# Patient Record
Sex: Male | Born: 1935 | Race: White | Hispanic: No | Marital: Married
Health system: Southern US, Community
[De-identification: ages and names within clinical notes are randomized; demographics above are authoritative.]

---

## 2004-02-13 ENCOUNTER — Other Ambulatory Visit: Payer: Self-pay

## 2004-12-20 ENCOUNTER — Ambulatory Visit: Payer: Self-pay | Admitting: Urology

## 2005-01-19 ENCOUNTER — Ambulatory Visit: Payer: Self-pay | Admitting: Urology

## 2005-01-24 ENCOUNTER — Ambulatory Visit: Payer: Self-pay | Admitting: Urology

## 2005-07-25 ENCOUNTER — Other Ambulatory Visit: Payer: Self-pay

## 2005-07-25 ENCOUNTER — Inpatient Hospital Stay: Payer: Self-pay | Admitting: Cardiology

## 2006-02-01 ENCOUNTER — Ambulatory Visit (HOSPITAL_COMMUNITY): Admission: RE | Admit: 2006-02-01 | Discharge: 2006-02-01 | Payer: Self-pay | Admitting: *Deleted

## 2006-02-08 ENCOUNTER — Ambulatory Visit (HOSPITAL_COMMUNITY): Admission: RE | Admit: 2006-02-08 | Discharge: 2006-02-09 | Payer: Self-pay | Admitting: *Deleted

## 2006-06-27 ENCOUNTER — Ambulatory Visit: Payer: Self-pay | Admitting: Urology

## 2006-06-27 ENCOUNTER — Other Ambulatory Visit: Payer: Self-pay

## 2006-07-03 ENCOUNTER — Ambulatory Visit: Payer: Self-pay | Admitting: Urology

## 2007-01-24 ENCOUNTER — Ambulatory Visit: Payer: Self-pay | Admitting: Internal Medicine

## 2007-05-16 ENCOUNTER — Ambulatory Visit: Payer: Self-pay | Admitting: Unknown Physician Specialty

## 2008-01-26 ENCOUNTER — Other Ambulatory Visit: Payer: Self-pay

## 2008-01-26 ENCOUNTER — Inpatient Hospital Stay: Payer: Self-pay | Admitting: Internal Medicine

## 2008-03-12 ENCOUNTER — Ambulatory Visit: Payer: Self-pay | Admitting: Internal Medicine

## 2008-03-12 ENCOUNTER — Inpatient Hospital Stay: Payer: Self-pay | Admitting: Internal Medicine

## 2008-03-13 ENCOUNTER — Other Ambulatory Visit: Payer: Self-pay

## 2008-05-14 ENCOUNTER — Ambulatory Visit: Payer: Self-pay | Admitting: Unknown Physician Specialty

## 2008-06-14 ENCOUNTER — Ambulatory Visit: Payer: Self-pay | Admitting: Urology

## 2008-12-31 ENCOUNTER — Emergency Department: Payer: Self-pay | Admitting: Emergency Medicine

## 2009-10-20 ENCOUNTER — Inpatient Hospital Stay: Payer: Self-pay | Admitting: Internal Medicine

## 2009-12-08 ENCOUNTER — Ambulatory Visit: Payer: Self-pay | Admitting: Unknown Physician Specialty

## 2010-09-06 ENCOUNTER — Ambulatory Visit: Payer: Self-pay | Admitting: Internal Medicine

## 2010-09-24 ENCOUNTER — Ambulatory Visit: Payer: Self-pay | Admitting: Internal Medicine

## 2010-10-16 ENCOUNTER — Ambulatory Visit: Payer: Self-pay | Admitting: Neurology

## 2010-10-24 ENCOUNTER — Ambulatory Visit: Payer: Self-pay | Admitting: Internal Medicine

## 2010-10-30 ENCOUNTER — Ambulatory Visit: Payer: Self-pay | Admitting: Neurology

## 2010-11-20 ENCOUNTER — Inpatient Hospital Stay: Payer: Self-pay | Admitting: Internal Medicine

## 2010-12-18 ENCOUNTER — Ambulatory Visit (HOSPITAL_COMMUNITY)
Admission: RE | Admit: 2010-12-18 | Discharge: 2010-12-18 | Disposition: A | Discharge status: Home / Self Care | Payer: Medicare Other | Source: Ambulatory Visit | Attending: Neurosurgery | Admitting: Neurosurgery

## 2010-12-18 ENCOUNTER — Other Ambulatory Visit (HOSPITAL_COMMUNITY): Payer: Self-pay | Admitting: Neurosurgery

## 2010-12-18 ENCOUNTER — Encounter (HOSPITAL_COMMUNITY)
Admission: RE | Admit: 2010-12-18 | Discharge: 2010-12-18 | Disposition: A | Discharge status: Home / Self Care | Payer: Medicare Other | Source: Ambulatory Visit | Attending: Neurosurgery | Admitting: Neurosurgery

## 2010-12-18 DIAGNOSIS — Z01812 Encounter for preprocedural laboratory examination: Secondary | ICD-10-CM | POA: Insufficient documentation

## 2010-12-18 DIAGNOSIS — Z01811 Encounter for preprocedural respiratory examination: Secondary | ICD-10-CM

## 2010-12-18 DIAGNOSIS — Z01818 Encounter for other preprocedural examination: Secondary | ICD-10-CM | POA: Insufficient documentation

## 2010-12-18 LAB — BASIC METABOLIC PANEL
BUN: 15 mg/dL (ref 6–23)
Calcium: 9.7 mg/dL (ref 8.4–10.5)
Creatinine, Ser: 1.44 mg/dL — ABNORMAL HIGH (ref 0.50–1.35)
GFR calc non Af Amer: 48 mL/min — ABNORMAL LOW (ref >60)
Glucose, Bld: 107 mg/dL — ABNORMAL HIGH (ref 70–99)
Sodium: 132 mEq/L — ABNORMAL LOW (ref 135–145)

## 2010-12-18 LAB — PROTIME-INR: Prothrombin Time: 13 seconds (ref 11.6–15.2)

## 2010-12-18 LAB — SURGICAL PCR SCREEN
MRSA, PCR: NEGATIVE
Staphylococcus aureus: POSITIVE — AB

## 2010-12-18 LAB — CBC
MCH: 32 pg (ref 26.0–34.0)
MCHC: 34.5 g/dL (ref 30.0–36.0)
Platelets: 234 10*3/uL (ref 150–400)
RBC: 5.28 MIL/uL (ref 4.22–5.81)
RDW: 13.3 % (ref 11.5–15.5)

## 2010-12-21 ENCOUNTER — Inpatient Hospital Stay (HOSPITAL_COMMUNITY)
Admission: RE | Admit: 2010-12-21 | Discharge: 2010-12-22 | DRG: 472 | Disposition: A | Discharge status: Home / Self Care | Payer: Medicare Other | Source: Ambulatory Visit | Attending: Neurosurgery | Admitting: Neurosurgery

## 2010-12-21 ENCOUNTER — Inpatient Hospital Stay (HOSPITAL_COMMUNITY): Payer: Medicare Other

## 2010-12-21 DIAGNOSIS — M5020 Other cervical disc displacement, unspecified cervical region: Secondary | ICD-10-CM | POA: Diagnosis present

## 2010-12-21 DIAGNOSIS — M4712 Other spondylosis with myelopathy, cervical region: Principal | ICD-10-CM | POA: Diagnosis present

## 2010-12-21 DIAGNOSIS — G9589 Other specified diseases of spinal cord: Secondary | ICD-10-CM | POA: Diagnosis present

## 2010-12-21 DIAGNOSIS — I739 Peripheral vascular disease, unspecified: Secondary | ICD-10-CM | POA: Diagnosis present

## 2010-12-21 DIAGNOSIS — E119 Type 2 diabetes mellitus without complications: Secondary | ICD-10-CM | POA: Diagnosis present

## 2010-12-21 DIAGNOSIS — Z794 Long term (current) use of insulin: Secondary | ICD-10-CM

## 2010-12-21 DIAGNOSIS — R339 Retention of urine, unspecified: Secondary | ICD-10-CM | POA: Diagnosis present

## 2010-12-21 DIAGNOSIS — I10 Essential (primary) hypertension: Secondary | ICD-10-CM | POA: Diagnosis present

## 2010-12-21 DIAGNOSIS — Z01812 Encounter for preprocedural laboratory examination: Secondary | ICD-10-CM

## 2010-12-21 DIAGNOSIS — Z01818 Encounter for other preprocedural examination: Secondary | ICD-10-CM

## 2010-12-21 DIAGNOSIS — G4733 Obstructive sleep apnea (adult) (pediatric): Secondary | ICD-10-CM | POA: Diagnosis present

## 2010-12-21 LAB — COMPREHENSIVE METABOLIC PANEL
Albumin: 3.2 g/dL — ABNORMAL LOW (ref 3.5–5.2)
Alkaline Phosphatase: 142 U/L — ABNORMAL HIGH (ref 39–117)
BUN: 17 mg/dL (ref 6–23)
BUN: 15 mg/dL (ref 6–23)
Calcium: 9 mg/dL (ref 8.4–10.5)
Chloride: 98 mEq/L (ref 96–112)
Creatinine, Ser: 1.26 mg/dL (ref 0.50–1.35)
GFR calc Af Amer: >60 mL/min (ref >60)
GFR calc Af Amer: >60 mL/min (ref >60)
GFR calc non Af Amer: 56 mL/min — ABNORMAL LOW (ref >60)
Glucose, Bld: 145 mg/dL — ABNORMAL HIGH (ref 70–99)
Total Bilirubin: 0.3 mg/dL (ref 0.3–1.2)
Total Protein: 6.8 g/dL (ref 6.0–8.3)

## 2010-12-21 LAB — PROTIME-INR
INR: 0.92 (ref 0.00–1.49)
Prothrombin Time: 12.3 seconds (ref 11.6–15.2)

## 2010-12-21 LAB — GLUCOSE, CAPILLARY
Glucose-Capillary: 137 mg/dL — ABNORMAL HIGH (ref 70–99)
Glucose-Capillary: 322 mg/dL — ABNORMAL HIGH (ref 70–99)

## 2010-12-22 LAB — GLUCOSE, CAPILLARY
Glucose-Capillary: 241 mg/dL — ABNORMAL HIGH (ref 70–99)
Glucose-Capillary: 211 mg/dL — ABNORMAL HIGH (ref 70–99)

## 2010-12-26 NOTE — Op Note (Signed)
NAMEJAMARKIS, BRANAM NO.:  [id number]  MEDICAL RECORD NO.:  [id number]  LOCATION:  3109                         FACILITY:  [facility]  PHYSICIAN:  Danae Orleans. Venetia Maxon, M.D.  DATE OF BIRTH:  11/18/1933  DATE OF PROCEDURE:  12/21/2010 DATE OF DISCHARGE:                              OPERATIVE REPORT   PREOPERATIVE DIAGNOSES:  Herniated cervical disk, C3-4 with cervical myelopathy; cervical spondylosis with myelopathy; cervical radiculopathy; and chronic obstructive pulmonary disease.  POSTOPERATIVE DIAGNOSES:  Herniated cervical disk, C3-4 with cervical myelopathy; cervical spondylosis with myelopathy; cervical radiculopathy; and chronic obstructive pulmonary disease.  PROCEDURE:  Anterior cervical decompression and fusion at C3-4 with PEEK interbody cage, morselized bone autograft, allograft, and anterior cervical plate.  SURGEON:  Danae Orleans. Venetia Maxon, MD  ASSISTANT:  Clydene Fake, MD  ANESTHESIA:  General endotracheal anesthesia.  ESTIMATED BLOOD LOSS:  Minimal.  COMPLICATIONS:  None.  DISPOSITION:  Recovery.  INDICATIONS:  Christopher Diaz is a 77 year old man with a profound cervical myelopathy with severe cervical stenosis at C3-4 with focal myelomalacia at this level.  It was elected to take him to surgery for anterior cervical decompression and fusion at this affected level.  PROCEDURE:  Following the uncomplicated induction of general endotracheal anesthesia and placement of intravenous lines with the patient maintained in neutral alignment and using the glide scope, the patient was continued in neutral alignment, placed on horseshoe head holder, placed in 5 pounds halter traction.  His anterior neck was then prepped and draped in usual sterile fashion.  The area of planned incision was infiltrated with local lidocaine.  Incision was made from the midline to the anterior border of sternocleidomastoid muscle on the upper neck creases on the left side of  midline, carried through platysma layer to expose the anterior border sternocleidomastoid muscle using blunt dissection.  The carotid sheath was kept lateral and trachea and esophagus kept medial.  The common facial vein was identified and coagulated and cut.  The initial x-ray showed markers at the C4-5 and C5- 6 level and consequently exposure was carried cephalad one level to the C3-4 level where second x-ray was obtained, which demonstrated spinal needle at the C3-4 level.  Subsequently, the longus colli muscles were taken down from the anterior cervical spine with electrocautery and Key elevator, and shadow line retractor blades were placed to facilitate exposure.  The interspace at C3-4 was incised and disk material was removed in piecemeal fashion.  Distraction pins were placed at C3 and C4 and using gentle distraction, the interspace was opened.  High-speed drill was used to remove uncinate spurs and eburnate the endplates of C3 and C4.  There was a deep osteophyte, which was removed with high-speed drill and more completely removed with Kerrison rongeurs.  The spinal cord dura was decompressed as were both C4 nerve roots as it they extended out the neural foramina.  Hemostasis was assured with Gelfoam soaked in thrombin.  After trial sizing, it was elected to use a 6-mm PEEK interbody cage, which was packed with allograft bone graft, reconstituted with PureGen stem cells and additionally, bone autograft, which was retained from drilling of the endplates at the C3  and C4 level.  The implant then inserted in the interspace and countersunk appropriately.  Traction weight was removed.  A 14-mm Trestle anterior cervical plate was affixed to the anterior cervical spine using variable angle 14-mm screws, two at C3 and two at T4.  All screws had excellent purchase.  Locking mechanisms were engaged.  Final x-ray demonstrated well-positioned interbody graft and anterior cervical plate.   Wound was then irrigated.  Soft tissues were inspected and found to be in good repair.  Hemostasis was assured.  The platysma layer was closed with 3-0 Vicryl sutures, and skin edges were approximated with 3-0 Vicryl subcuticular stitch.  The wound was dressed with Dermabond.  The patient was extubated in the operating room and taken to recovery in stable satisfactory condition having tolerated the operation well.  Counts were correct at the end of the case.     Danae Orleans. Venetia Maxon, M.D.     JDS/MEDQ  D:  12/21/2010  T:  12/22/2010  Job:  [id number]  Electronically Signed by Maeola Harman M.D. on 12/26/2010 08:50:12 AM

## 2010-12-28 NOTE — Discharge Summary (Signed)
  NAMEDASHAUN, ONSTOTT NO.:  [id number]  MEDICAL RECORD NO.:  [id number]  LOCATION:  3017                         FACILITY:  [facility]  PHYSICIAN:  Stefani Dama, M.D.  DATE OF BIRTH:  [birth year]  DATE OF ADMISSION:  12/21/2010 DATE OF DISCHARGE:  12/22/2010                              DISCHARGE SUMMARY   ADMITTING DIAGNOSIS:  Cervical spondylosis with myelopathy C3-C4.  DISCHARGE AND FINAL DIAGNOSES: 1. Cervical spondylosis with myelopathy C3-C4 status post anterior     cervical decompression arthrodesis C3-C4. 2. Urinary retention.  CONDITION ON DISCHARGE:  Improving.  HOSPITAL COURSE:  Mr. Bert Ptacek is a 77 year old individual who has had significant problems with spondylitic myelopathy including quadriparesis and dysesthesias in all four extremities.  He had evidence of spinal cord compression at the level of C3-C4 secondary to spondylitic overgrowth and severely degenerated disk.  He was advised regarding need for surgical decompression.  This was performed on December 21, 2010 postoperatively.  The patient awoke well and minimal difficulties with swallowing however upon ambulation, he felt better, but he could not spontaneously urinate, had urinary retention with documented urine quantities of 700 mL in his bladder before a straight catheter was passed.  He subsequently was started on Flomax and after an additional straight catheterization, the patient was able to void spontaneously with residuals as low as 20 mL.  At the current time, he is discharged home with prescription for Vicodin, maintains his other home medications which include: 1. Linagliptin 5 mg a day. 2. Zetia. 3. Insulin. 4. Celexa. 5. Amlodipine. 6. Advair inhalers.  She will be seen in followup in 3 weeks' time.     Stefani Dama, M.D.     Merla Riches  D:  12/22/2010  T:  12/23/2010  Job:  [id number]  Electronically Signed by Barnett Abu M.D. on 12/28/2010 05:23:49 PM

## 2011-06-03 ENCOUNTER — Inpatient Hospital Stay: Payer: Self-pay | Admitting: Internal Medicine

## 2011-07-10 ENCOUNTER — Ambulatory Visit: Payer: Self-pay | Admitting: Internal Medicine

## 2011-10-29 ENCOUNTER — Ambulatory Visit: Payer: Self-pay

## 2012-03-26 ENCOUNTER — Other Ambulatory Visit: Payer: Self-pay | Admitting: Neurology

## 2012-05-16 ENCOUNTER — Ambulatory Visit: Payer: Self-pay | Admitting: Neurology

## 2012-06-25 ENCOUNTER — Ambulatory Visit: Payer: Self-pay | Admitting: Internal Medicine

## 2012-07-21 ENCOUNTER — Inpatient Hospital Stay: Payer: Self-pay | Admitting: Student

## 2012-07-21 LAB — CBC
HGB: 16.5 g/dL (ref 13.0–18.0)
MCH: 30.4 pg (ref 26.0–34.0)
MCHC: 32.5 g/dL (ref 32.0–36.0)
RDW: 13.8 % (ref 11.5–14.5)

## 2012-07-21 LAB — URINALYSIS, COMPLETE
Bilirubin,UR: NEGATIVE
Blood: NEGATIVE
Ketone: NEGATIVE
Leukocyte Esterase: NEGATIVE
Nitrite: NEGATIVE
RBC,UR: 2 /HPF (ref 0–5)

## 2012-07-21 LAB — COMPREHENSIVE METABOLIC PANEL
Alkaline Phosphatase: 206 U/L — ABNORMAL HIGH (ref 50–136)
Anion Gap: 8 (ref 7–16)
Chloride: 96 mmol/L — ABNORMAL LOW (ref 98–107)
Creatinine: 2.09 mg/dL — ABNORMAL HIGH (ref 0.60–1.30)
EGFR (African American): 35 — ABNORMAL LOW
EGFR (Non-African Amer.): 30 — ABNORMAL LOW
Glucose: 175 mg/dL — ABNORMAL HIGH (ref 65–99)
Osmolality: 273 (ref 275–301)
SGOT(AST): 59 U/L — ABNORMAL HIGH (ref 15–37)
SGPT (ALT): 73 U/L (ref 12–78)
Sodium: 131 mmol/L — ABNORMAL LOW (ref 136–145)
Total Protein: 7.3 g/dL (ref 6.4–8.2)

## 2012-07-21 LAB — TROPONIN I: Troponin-I: <0.02 ng/mL

## 2012-07-22 LAB — TROPONIN I
Troponin-I: <0.02 ng/mL
Troponin-I: <0.02 ng/mL

## 2012-07-23 LAB — BASIC METABOLIC PANEL
Anion Gap: 12 (ref 7–16)
BUN: 56 mg/dL — ABNORMAL HIGH (ref 7–18)
EGFR (African American): 31 — ABNORMAL LOW
EGFR (Non-African Amer.): 26 — ABNORMAL LOW
Potassium: 3.3 mmol/L — ABNORMAL LOW (ref 3.5–5.1)

## 2012-07-23 LAB — URINE CULTURE

## 2012-07-24 LAB — BASIC METABOLIC PANEL WITH GFR
Anion Gap: 8
BUN: 51 mg/dL — ABNORMAL HIGH
Calcium, Total: 9.7 mg/dL
Chloride: 108 mmol/L — ABNORMAL HIGH
Co2: 23 mmol/L
Creatinine: 2.09 mg/dL — ABNORMAL HIGH
EGFR (African American): 35 — ABNORMAL LOW
EGFR (Non-African Amer.): 30 — ABNORMAL LOW
Glucose: 208 mg/dL — ABNORMAL HIGH
Osmolality: 297
Potassium: 4.1 mmol/L
Sodium: 139 mmol/L

## 2012-07-25 LAB — BASIC METABOLIC PANEL
Anion Gap: 8 (ref 7–16)
Chloride: 109 mmol/L — ABNORMAL HIGH (ref 98–107)
Creatinine: 1.65 mg/dL — ABNORMAL HIGH (ref 0.60–1.30)
EGFR (African American): 46 — ABNORMAL LOW
EGFR (Non-African Amer.): 40 — ABNORMAL LOW
Potassium: 3.6 mmol/L (ref 3.5–5.1)
Sodium: 140 mmol/L (ref 136–145)

## 2012-07-26 ENCOUNTER — Ambulatory Visit: Payer: Self-pay | Admitting: Internal Medicine

## 2012-07-27 LAB — BASIC METABOLIC PANEL
Anion Gap: 12 (ref 7–16)
Calcium, Total: 9.4 mg/dL (ref 8.5–10.1)
Creatinine: 1.45 mg/dL — ABNORMAL HIGH (ref 0.60–1.30)
EGFR (African American): 54 — ABNORMAL LOW
Glucose: 261 mg/dL — ABNORMAL HIGH (ref 65–99)
Sodium: 141 mmol/L (ref 136–145)

## 2012-08-16 IMAGING — CR DG CHEST 2V
3 series · 3 of 3 positions shown · non-contrast
Comparison: None

CLINICAL DATA: Preop for cervical fusion.

CHEST - 2 VIEW

[view not recorded (1 of 3)]
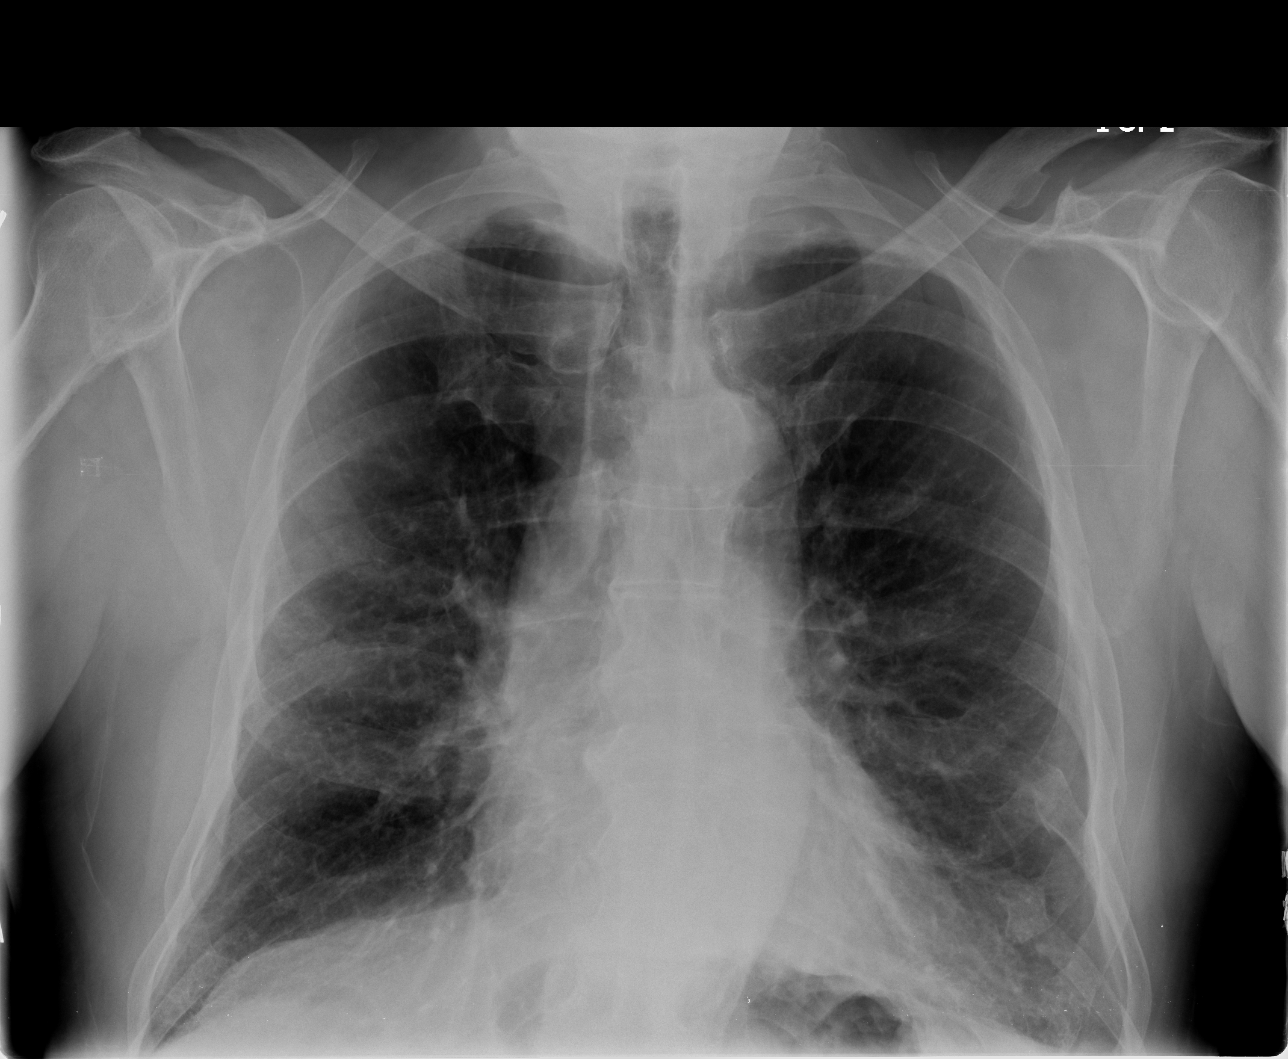

[view not recorded (2 of 3)]
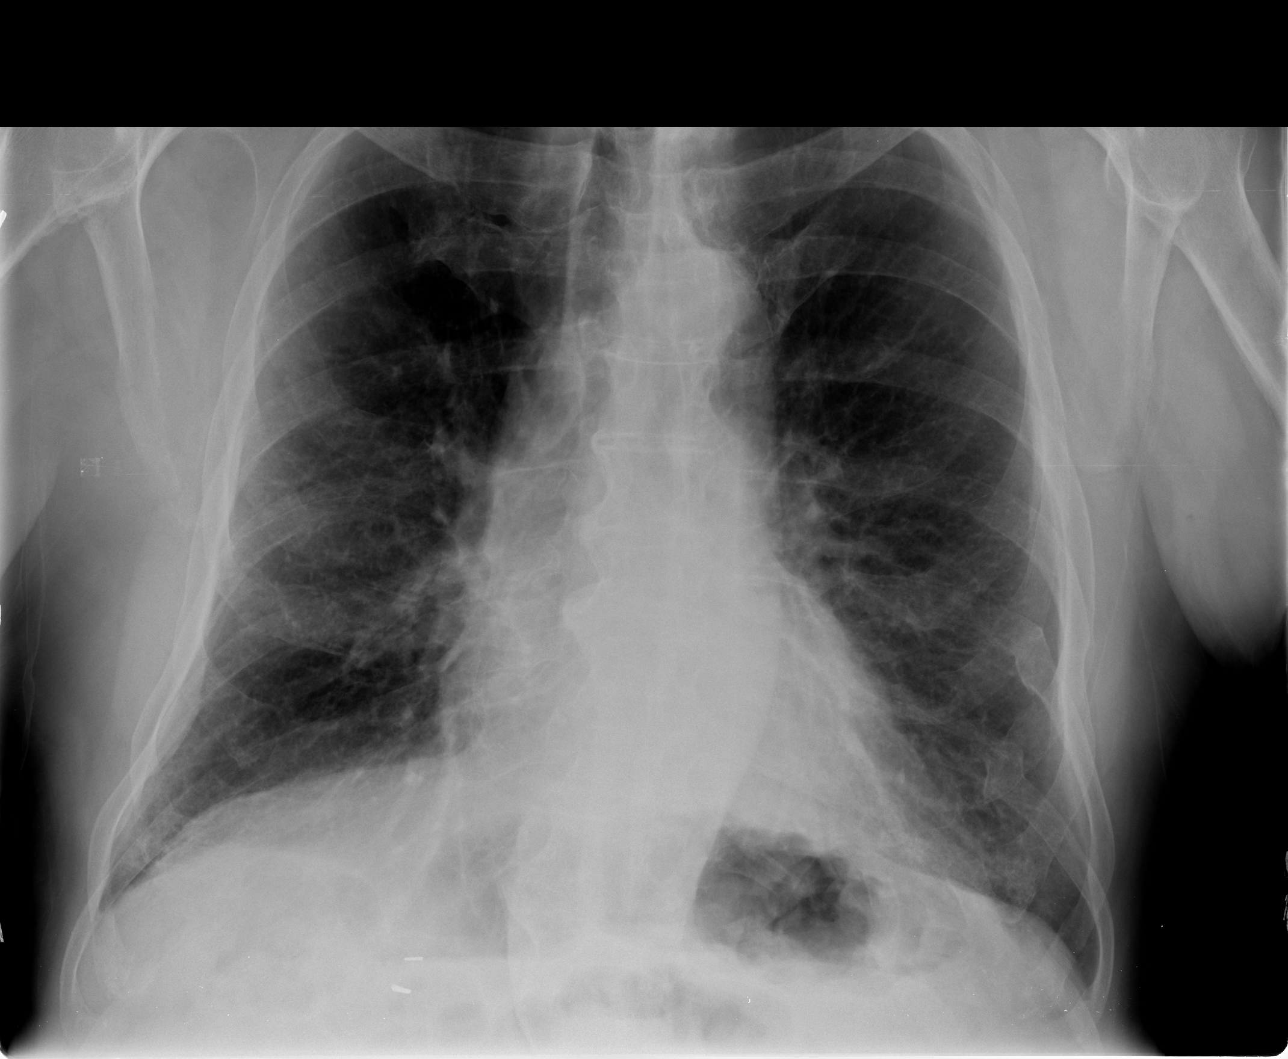

[view not recorded (3 of 3)]
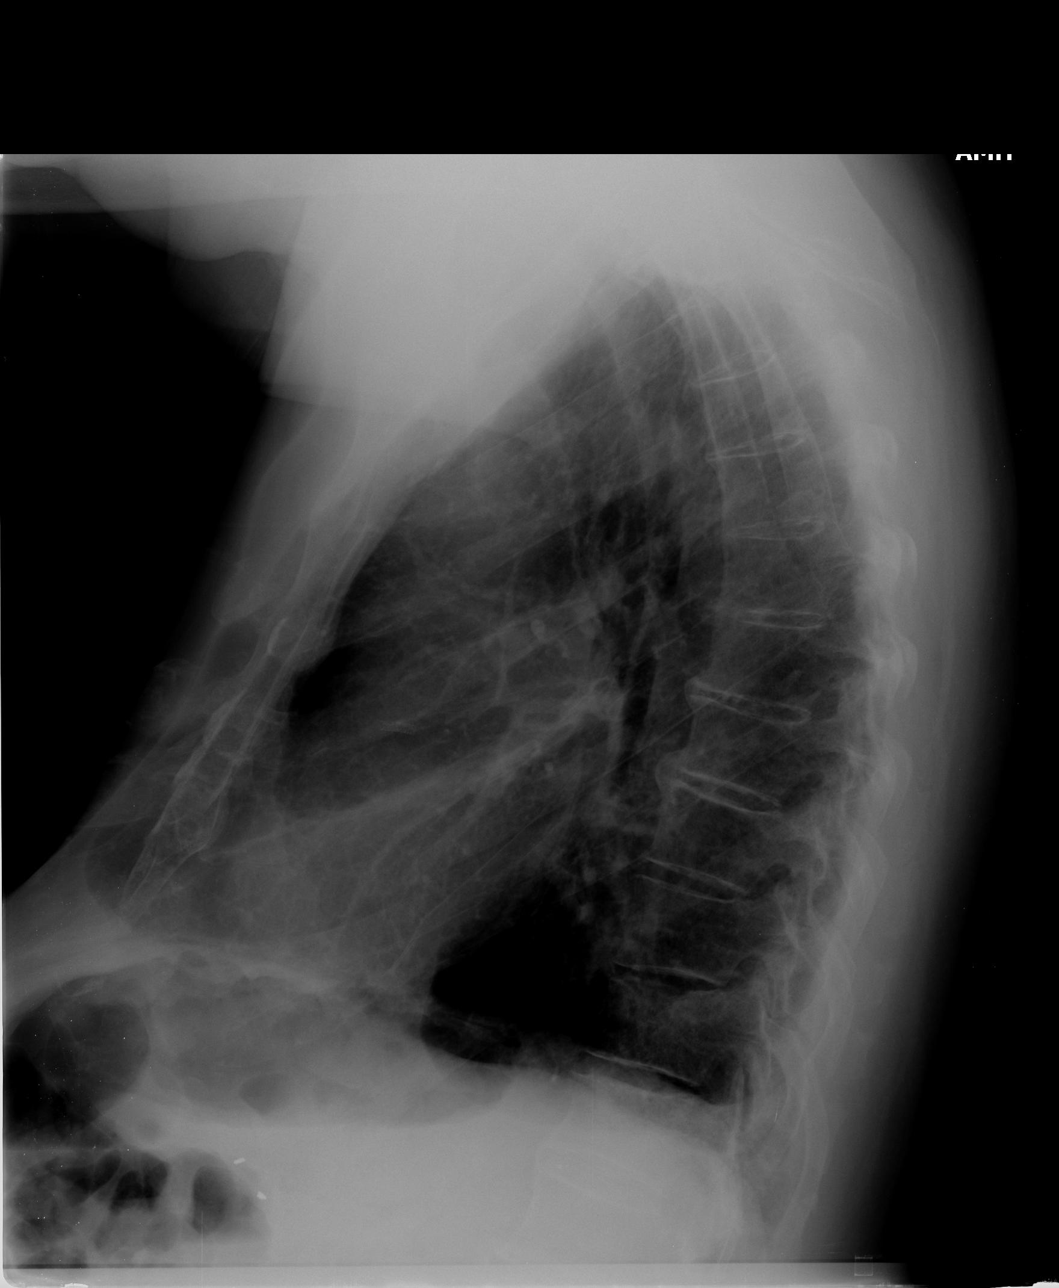

[3 of 3 positions shown; findings below may reference images not displayed]

FINDINGS: The cardiac silhouette, mediastinal and hilar contours
are within normal limits for age.  There are chronic-appearing
bronchitic type interstitial lung changes and areas of scarring.
Biapical pleural and parenchymal scarring changes are noted.  No
definite acute overlying pulmonary process.  Remote healed rib
fractures are noted.
IMPRESSION: Chronic-appearing lung changes without definite acute overlying
pulmonary process.

## 2012-08-23 ENCOUNTER — Ambulatory Visit: Payer: Self-pay | Admitting: Internal Medicine

## 2012-08-23 DEATH — deceased

## 2014-03-20 IMAGING — CT CT HEAD WITHOUT CONTRAST
2 of 3 series · 16 of 30 positions shown, 19 images · non-contrast
Comparison: none

REASON FOR EXAM: fall, weakness
COMMENTS:

[Series 2: soft tissue · axial · 0.39mm/px · z∈[-2,+108]mm · 7 of 30 slices shown]
[im 4/30  brain]
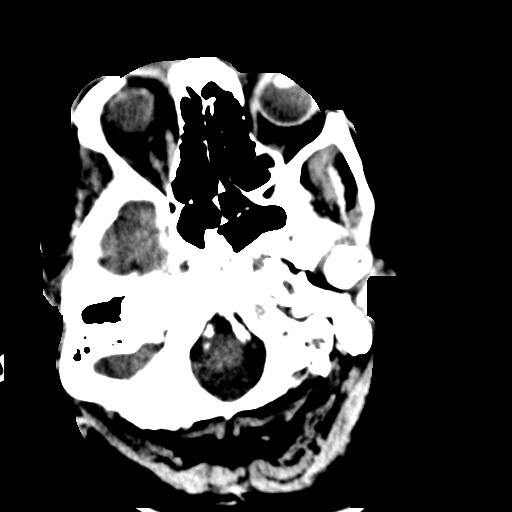
[im 8/30  brain]
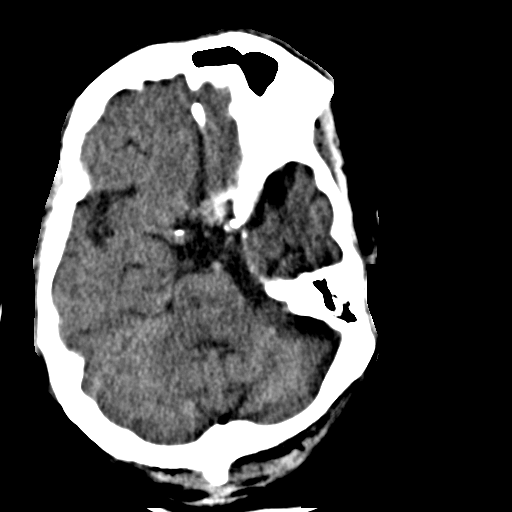
[im 11/30  brain]
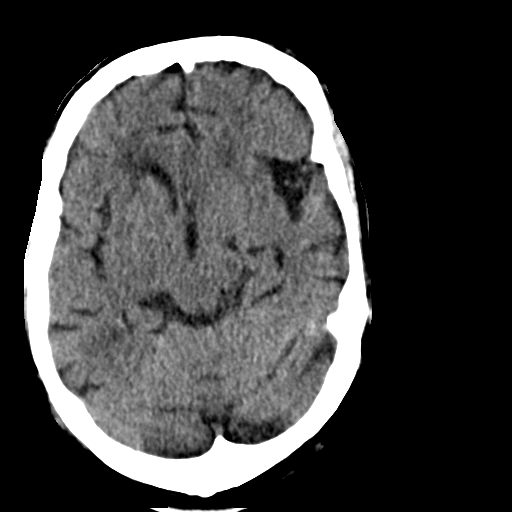
[im 15/30  brain]
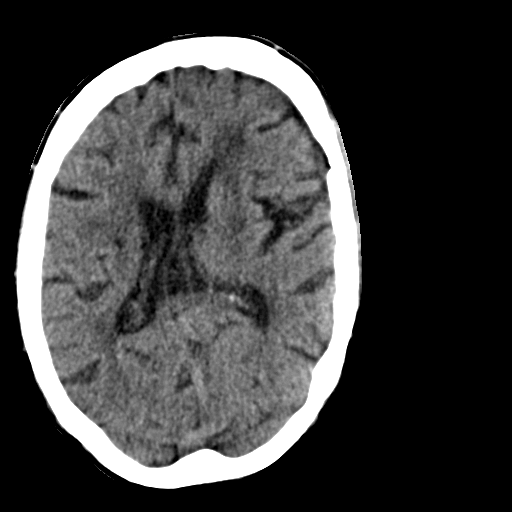
[im 19/30  brain]
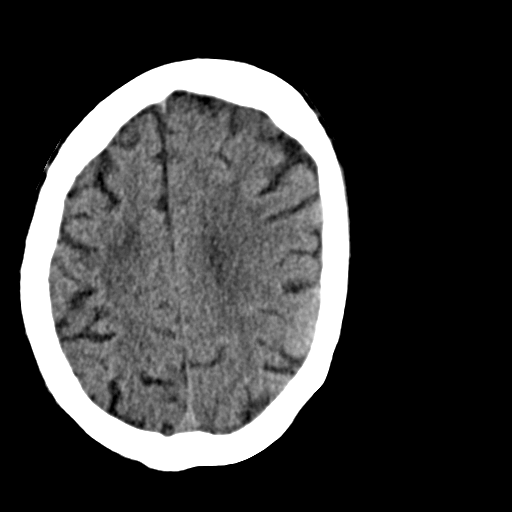
[im 22/30  brain]
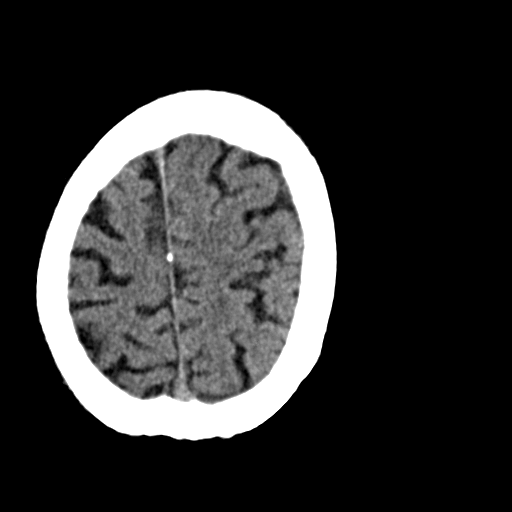
[im 26/30  brain]
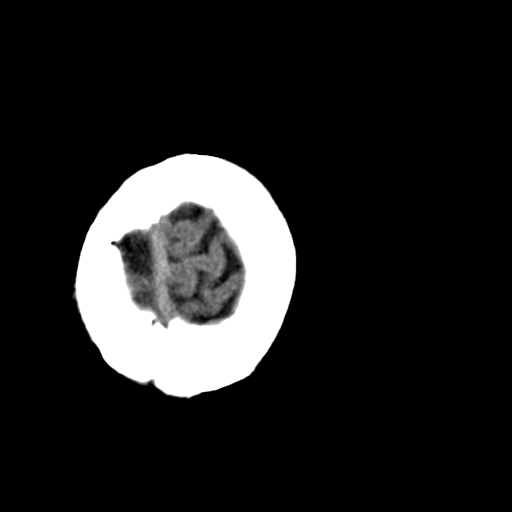

[Series 4: soft tissue 2 · axial · 0.39mm/px · z∈[-41,+96]mm · 9 of 36 slices shown, 12 images]
[im 4/36  brain]
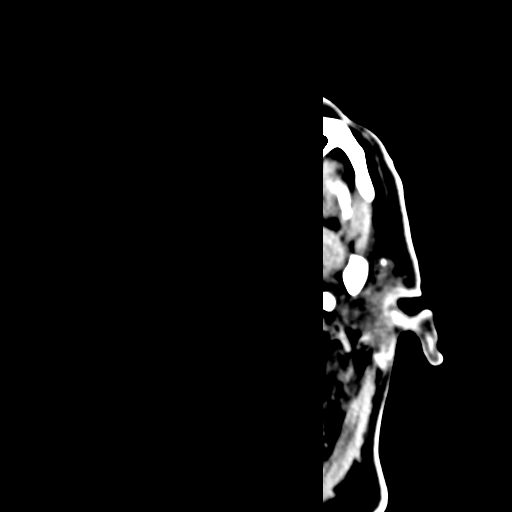
[im 4/36  bone]
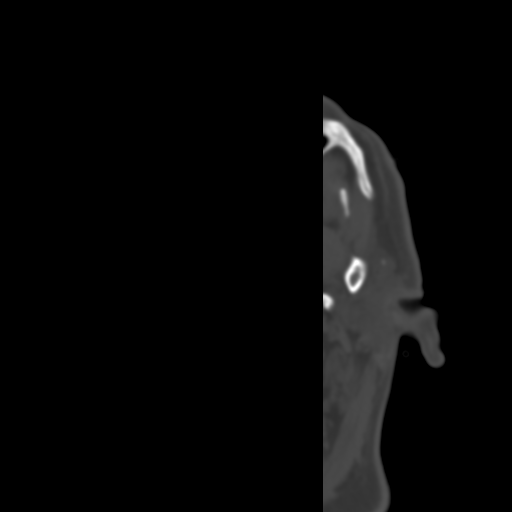
[im 8/36  brain]
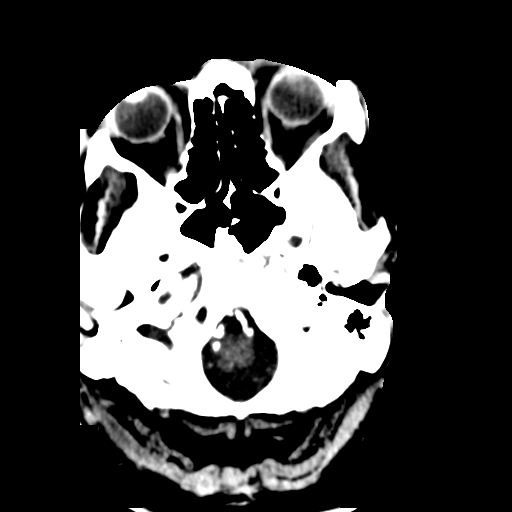
[im 11/36  brain]
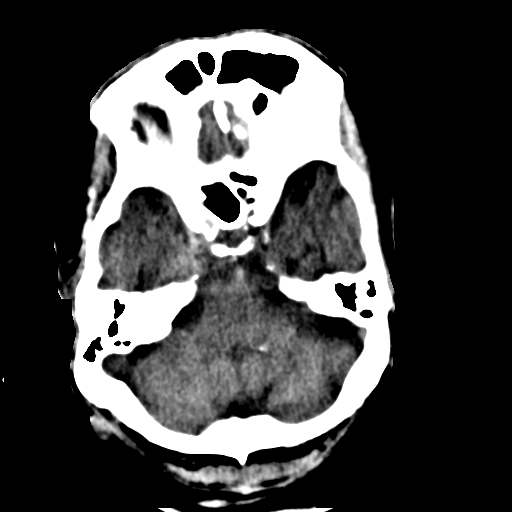
[im 15/36  brain]
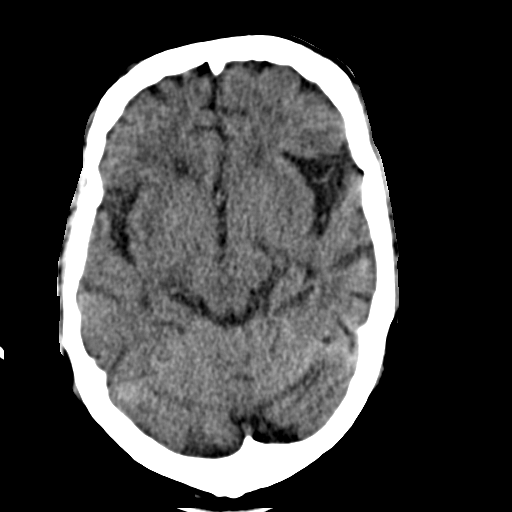
[im 18/36  brain]
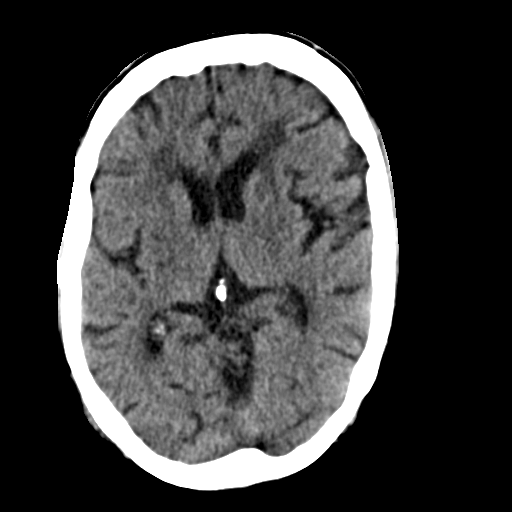
[im 18/36  bone]
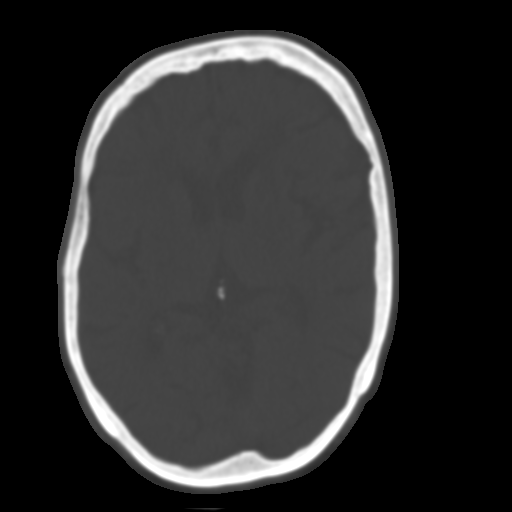
[im 22/36  brain]
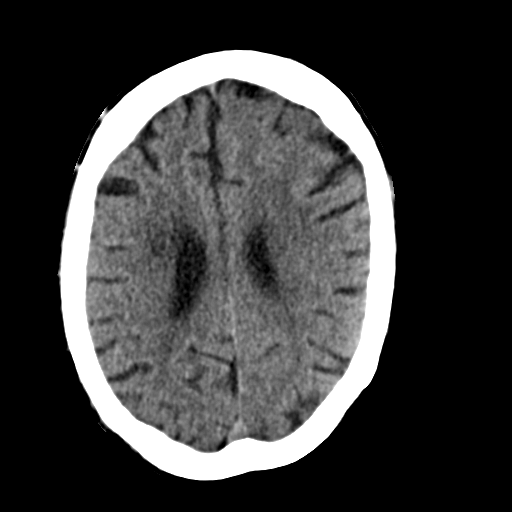
[im 25/36  brain]
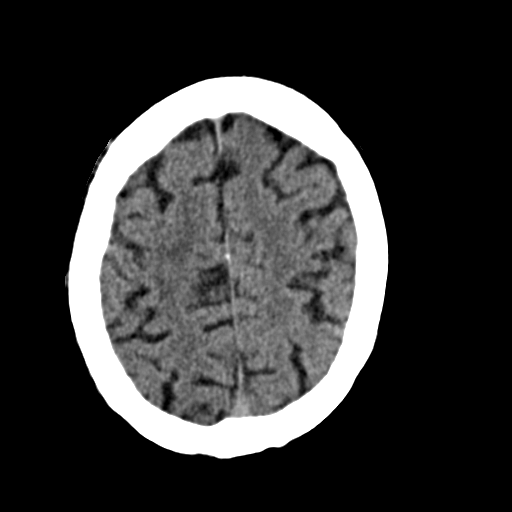
[im 29/36  brain]
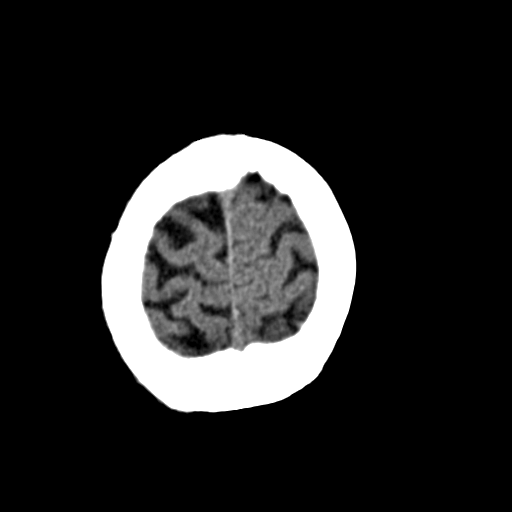
[im 32/36  brain]
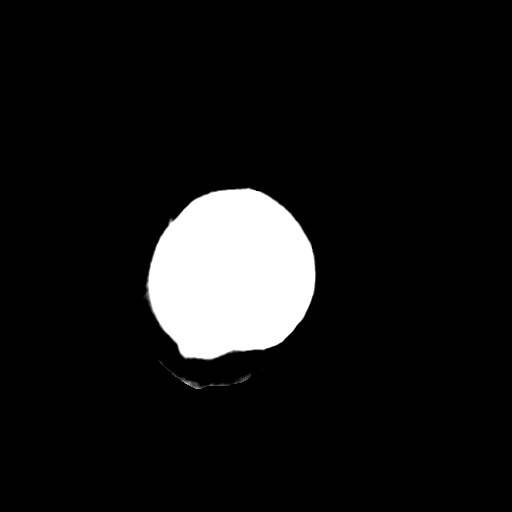
[im 32/36  bone]
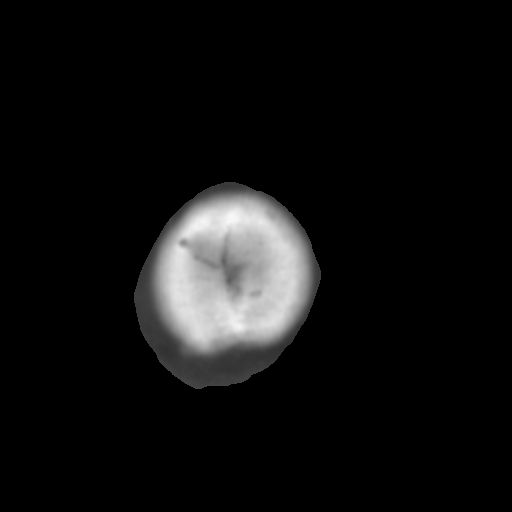

[16 of 30 positions shown; findings below may reference images not displayed]

PROCEDURE:     CT  - CT HEAD WITHOUT CONTRAST  - July 21, 2012 [DATE]

RESULT:     Noncontrast emergent CT of the brain is compared to the previous
exam dated 08 June, 2011.

There is prominence of the ventricles and sulci consistent with atrophy.
Low-attenuation is seen diffusely in the periventricular and subcortical
white matter. Basal ganglia lacunar infarcts are present bilaterally. There
is no evidence of intracranial hemorrhage, mass, mass effect or midline
shift. The included mastoid air cells and paranasal sinuses show normal
aeration. The orbits and calvarium appear unremarkable.
IMPRESSION: Changes of atrophy with chronic microvascular ischemic
disease. No acute intracranial abnormality evident. A small basal ganglia
lacunar infarcts are present.

[REDACTED]

## 2014-10-15 NOTE — Consult Note (Signed)
History of Present Illness:   History of Present Illness Pt seen for follow up. His wife Inocencio HomesGayle and son were present in the room. They reported that pt is becoming more agitated and he was unable to sleep last night. He was given Ambien which made him worse. he has been hallucinating, trying to jump out of bed. He is not eating well, responding to internal stimuli.  During my interview, pt reported that he is in my home. He was able to recognize his wife and son. He was unable to tell if his son was married. He has somewhat rambling speech. He appeared to be responding to internal stimuli and was somewhat agitated. Wife was concerned about his condition.   Target Symptoms:   Manic Impaired Judgment    Psychosis Hallucinations  Delusions  Disorganization    Behavior Agitation  Aggression  Disinhibition    Arousal/Cognitive Altered Consciousness  Behavior Disturbances   PAST MEDICAL & SURGICAL HX:  Significant Events:   parkinsons:    Depression:    Bladder tumor:    CVA:    Sleep apnea:    Hyperlipidemia:    CAD:    HTN:    Diabetes:    gallbladder removed:    bladder tumor removal surgery x 2:    COPD:    Stent - Cardiac:    stents in legs: 2006  CURRENT OUTPATIENT MEDICATIONS:  Home Medications: Medication Instructions Status  clopidogrel 75 mg oral tablet 1 tab(s) orally once a day Active  metoprolol tartrate 50 mg oral tablet 1 tab(s) orally 2 times a day Active  benazepril 40 mg oral tablet 1 tab(s) orally once a day Active  amlodipine 10 mg oral tablet 1 tab(s) orally once a day Active  Levemir 100 units/mL subcutaneous solution 20 unit(s) subcutaneous once a day (at bedtime) Active  amantadine 100 mg oral capsule 1 cap(s) orally 2 times a day Active   Mental Status Exam:   Speech Non-fluent    Mood Irritable    Affect Irritable    Thought Processes Tangential    Thought Content Delusions    Attention Awake    Concentration Poor    Memory  Impaired    Judgement Poor    Insight Poor   Suicide Risk Assessment: Suicide Risk Level No risk inidicated.  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  Treatment Plan: Discussed with Pt's  Wife and son about the treatment plan and medication, risks, alternatives in in detail.  Discussed about the Black Box warning of Risperdal including sudden risk of death, increased risk of stroke in demetia pts. They demonstarted understanding.  Will titrate the dose of Risperdal to 0.25mg  po BID to control agitation.  Add Namenda 5mg  po qdaily for Dementia and agitation.  D/C Zolpidem.  Taper Amantadine as it is causing worsening of psychotic symptoms.  Discussed about discharge planning and he can be placed in SNF once clincally stable.   Will follow as needed.  Electronic Signatures: Rhunette CroftFaheem, Valentino Saavedra S (MD)  (Signed 30-Jan-14 13:46)  Authored: History of Present Illness, Target Symptoms, PAST MEDICAL & SURGICAL HX, CURRENT OUTPATIENT MEDICATIONS, Mental Status Exam, Suicide Risk Assessment, Assessment & Diagnosis, Treatment Plan   Last Updated: 30-Jan-14 13:46 by Rhunette CroftFaheem, Rennie Hack S (MD)

## 2014-10-15 NOTE — Discharge Summary (Signed)
PATIENT NAME:  Christopher Diaz, Christopher Diaz MR#:  [id number] DATE OF BIRTH:  [birth year]  DATE OF ADMISSION:  07/21/2012 DATE OF DISCHARGE:    PRIMARY CARE PHYSICIAN:  Alonna Buckler, MD  CONSULTANTS HERE:  1.  Dr. Harvie Junior palliative care.  2.  Dr. Lady Gary from cardiology.  3.  Dr. Garnetta Buddy from psychiatry.   CHIEF COMPLAINT:  Coughing.   DISCHARGE DIAGNOSES: 1.  Acute respiratory failure from chronic obstructive pulmonary disease exacerbation.  2.  Acute on chronic renal failure.  3.  Paroxysmal atrial fibrillation with rapid ventricular response, now in sinus.  4.  Hypertension.  5.  Parkinson's dementia.  6.  Depression.  7.  Insomnia.  8.  History of bladder cancer.  9.  Obstructive sleep apnea on CPAP at night.  10.  History of coronary artery disease.  11.  Peripheral vascular disease.  12.  Hypertension.  13.  Diabetes.  DISCHARGE MEDICATIONS:  Plavix 75 mg daily, amlodipine 10 mg daily, Levemir 20 units at bedtime, metoprolol 25 mg 2 times a day, acetaminophen 650 mg every 4 hours as needed for pain or temperature greater than 100.4, trazodone 50 mg daily, citalopram 10 mg daily. aspirin 81 mg daily, risperidone 0.25 mg 2 times a day, amantadine 100 mg one cap once a day.  Please taper of slowly.  Albuterol/ipratropium nebulizers 3 mL inhaled 4 times a day as needed for wheezing, diltiazem 30 mg every 8 hours, memantine 5 mg daily, Protonix 40 mg daily, prednisone 20 mg daily for 2 days, then 10 mg daily for 2 days and stop, Spiriva 18 mcg inhaled daily.   The patient will going home with 2 liters of oxygen via nasal cannula, please wean off as tolerated.   DIET: Low sodium, ADA diet, mechanical soft, thin liquids, ground meats with gravy added.  Full aspiration precautions.  Meds in puree, crush as able to.  Feed at all meals. Reduce distractions.  Please send extra gravy on all trays to moisten food.  Yogurt t.i.d. with meals.  Creamed soup instead of broth soup.  Magic cup ice cream and Carnation  Instant Breakfast.   ACTIVITY: As tolerated.   FOLLOWUP:  Please follow with cardiologist within 1 to 2 weeks.  Please follow PCP within 1 to 2 weeks. Please follow and obtain a BMP in about a week.   DISPOSITION: To [facility].   CODE STATUS: DO NOT RESUSCITATE.   HISTORY OF PRESENT ILLNESS: For full details of history and physical, please see the dictation on January by Dr. Renae Gloss, but briefly, this is a 81 year old male with history of Parkinson's dementia, diabetes who presents with fall and cough. He was noted to have wheezing and shortness of breath and mild renal failure and a COPD exacerbation and admitted to the hospitalist service for further evaluation and management.   SIGNIFICANT LABORATORY AND DIAGNOSTIC DATA:  Initial BUN 29, creatinine 2.09, sodium 131. Last sodium of 141, creatinine of 1.45. LFTs: Alkaline phosphatase 209, AST 59, ALT is 73, troponins were negative x 3. WBC is within normal limits. Platelets 116. Rapid flu negative. Urine cultures no growth to date. Urinalysis not suggestive of infection. Echocardiogram done on February 3rd showed EF of 55%, mild mitral regurgitation. CT of the head without contrast on admission showing no acute intracranial abnormality. Small basal ganglia lacunar infarcts are present. X-ray of the chest, PA and lateral on 01/27 showing COPD with findings likely representing underlying component of pulmonary fibrosis.  X-ray of chest repeated on February  2nd showing bilateral diffuse interstitial thickening likely representing interstitial edema versus pneumonitis.   HOSPITAL COURSE:  The patient was admitted to the hospitalist service. The patient was started on IV steroids, antibiotics and inhalers for respiratory failure which was likely secondary to COPD.  The patient was also started on oxygen.  We did a rapid flu test, which was negative but he did and get Tamiflu. He is to finish the Tamiflu nonetheless. His wheezing has  significantly improved. He is not short of breath, but he is on oxygen.  Please wean as tolerated.  The patient did have acute on chronic renal failure on admission, likely secondary to dehydration. He has had good urine output with IV fluids and is back to baseline. He likely has stage III CKD.   Atrial fibrillation with RVR. The patient did have acute onset atrial fibrillation with RVR with rates up to 190s. This happened on February 2nd .  He did not respond to diltiazem 10 mg IV x 1 and therefore transferred to the CCU where he has been since. He was started on diltiazem drip and soon afterwards he did convert to normal sinus rhythm, which he has been sent. Cardiology was consulted and an echocardiogram was obtained. The diltiazem drip was weaned off and he has been transitioned into diltiazem p.o. He did have some sinus bradycardia and therefore his metoprolol dose will be decreased to 25 mg to allow accommodation of Cardizem at this point. He has been on aspirin and Plavix. The anticoagulation was brought up with the patient's wife.  At this point cardiology will recommend aspirin and Plavix. He is a fall risk. He is not aware of his surroundings much and has had falls in the past and is not a great Coumadin or anticoagulation candidate at this point. The family is aware. He will be discharged with aspirin and Plavix and see how he does post-rehab and if he gets stronger to a point of better  strengthening, he can follow with Dr. Darrold JunkerParaschos, his outpatient cardiologist and consider anticoagulation at that time.  The wife is okay with this plan.    At this point, he will be discharged to outpatient follow-up as dictated above.   His hypertension, hyperlipidemia and diabetes medications should be resumed as an outpatient. The patient was seen by palliative care and he currently is DO NOT RESUSCITATE.   Total time spent: 35 minutes.     ____________________________ Krystal EatonShayiq Seriyah Collison,  MD sa:ct D: 07/29/2012 13:20:01 ET T: 07/29/2012 13:48:55 ET JOB#: [id number]  cc: Krystal EatonShayiq Glennice Marcos, MD, <Dictator> Reola MosherAndrew S. [name], MD Marcina MillardAlexander Paraschos, MD Krystal EatonSHAYIQ Choua Ikner MD ELECTRONICALLY SIGNED 07/31/2012 20:10

## 2014-10-15 NOTE — Consult Note (Signed)
General Aspect 81 yo male with history of cva and depression who was admitted severeal edays ago with wekaness and fatigue. He was mildly dehydrated. He has history of syncope in the past and cad s/p pci treated with plavix. He developed afib with rvr causing transfer to ccu. He was given iv cardizem and converted to sinus rhythm and has remained in sr. He has ruled out for an mi. He denies any complaints at present but is a somewhat diffucult historian.   Physical Exam:   GEN disheveled    HEENT hearing intact to voice    NECK supple    RESP no use of accessory muscles    CARD Regular rate and rhythm  Bradycardic  Murmur    Murmur Systolic    Systolic Murmur axilla    ABD denies tenderness  normal BS    LYMPH negative neck    EXTR negative cyanosis/clubbing    SKIN normal to palpation    PSYCH poor insight, lethargic   Review of Systems:   Subjective/Chief Complaint weakness and fatigue    General: Fatigue  Weakness    ENT: No Complaints    Eyes: No Complaints    Neck: No Complaints    Respiratory: Frequent cough  Short of breath    Cardiovascular: No Complaints    Gastrointestinal: No Complaints    Genitourinary: No Complaints    Vascular: No Complaints    Musculoskeletal: No Complaints    Hematologic: No Complaints    Endocrine: No Complaints    Psychiatric: Depression    Review of Systems: All other systems were reviewed and found to be negative    Medications/Allergies Reviewed Medications/Allergies reviewed     Dementia: per chart note   parkinsons:    Depression:    Bladder tumor:    CVA:    Sleep apnea:    Hyperlipidemia:    CAD:    HTN:    Diabetes:    gallbladder removed:    bladder tumor removal surgery x 2:    COPD:    Stent - Cardiac:    stents in legs: 2006  Home Medications: Medication Instructions Status  clopidogrel 75 mg oral tablet 1 tab(s) orally once a day Active  metoprolol tartrate 50 mg oral  tablet 1 tab(s) orally 2 times a day Active  benazepril 40 mg oral tablet 1 tab(s) orally once a day Active  amlodipine 10 mg oral tablet 1 tab(s) orally once a day Active  Levemir 100 units/mL subcutaneous solution 20 unit(s) subcutaneous once a day (at bedtime) Active  amantadine 100 mg oral capsule 1 cap(s) orally 2 times a day Active   EKG:   Interpretation sinus bradycardia at present. had episode of afib with rvr earlier conv erted to nsr    Zocor: Other    Impression 81 yo male with history of cad and pvd treated with stents, history of depression and previous cva who was admitted with shortness of breath and weakness. Developed afib with rvr prompting transfer to ccu. Converted back to nsr withi iv cardizem. Does not appear to be an ideal cnadidate for chornic warfarin therapy at present due to comorbid conditions and fall risk although chadss score is greater than 3. Would discontinue iv cardizem and convert to po cardizem.    Plan 1. Discontinue iv cardizem and place on cardizem 30 mg tid 2. FOllow heart rate and rhythm 3. Will discuss chronic antiocagulation prior to discharge but at this point would ocntinue  with asa and plavix 4. Transfer to telemetry.   Electronic Signatures: Dalia HeadingFath, Cyndal Kasson A (MD)  (Signed 03-Feb-14 08:18)  Authored: General Aspect/Present Illness, History and Physical Exam, Review of System, Past Medical History, Home Medications, EKG , Allergies, Impression/Plan   Last Updated: 03-Feb-14 08:18 by Dalia HeadingFath, Roisin Mones A (MD)

## 2014-10-15 NOTE — Consult Note (Signed)
PATIENT NAME:  Christopher Diaz, Christopher Diaz MR#:  [id number] DATE OF BIRTH:  Apr 24, 1934  DATE OF CONSULTATION:  01/Linton Rump28/2014  REFERRING PHYSICIAN:  Alford Highlandichard Wieting, MD CONSULTING PHYSICIAN:  Ardeen FillersUzma S. Garnetta BuddyFaheem, MD  REASON FOR CONSULT: Depression. Does not want to get out of bed or leave the house.  HISTORY OF PRESENT ILLNESS: The patient is a 81 year old married [place] male with long history of Parkinson's disease, dementia and multiple medical problems admitted to the hospital after he sustained two falls on Saturday, one in the morning and one in the afternoon. Initial history was obtained from his wife, Christopher Diaz. She reported that the patient is not motivated to get out of the bed and he does not leave the house unless he has to go to the doctor's office. He did not obtain his flu shot and had his last Pneumovax in 2012. He mostly is asleep very easily and is not motivated to participate in any activities.   During my interview the patient was sitting in the chair. He reported that he is tired most of the time. He stated that he is not interested in any activity and he feels like "dragging". He stated that food does not taste right. He also mentioned about having hallucinations and sees things like squirrels and small animals. However, he stated that they do not talk to him and he is not having any auditory hallucinations. He reported that he does not have any energy or motivation to do anything. He reported that there is no purpose and he just seems so tired that he wants to lie in the bed.   Collateral  information was obtained from the patient's wife, Christopher MoynahanGayle Diaz, who is the power of attorney over the telephone.  She reported that the patient has been becoming more depressed since he was diagnosed with Parkinson's in 2012. He will usually stay in the bed and he will get up in the morning, drink his coffee and eat a little and then will stay in the bed until 4:00 p.m. At that time he will get up, eat supper and will  go around to watch television. He does not have any hobbies and he has given up completely on everything. He will only get out of the house to the attend to his MD appointments. He is not socializing, does not check his blood sugar, and is not taking the insulin regularly. The wife has been insisting on him to take showers which he will take on a weekly basis. She reported that he used to take a shower every night. The patient's wife reported that he has been tried on multiple psychotropic medications in the past, but they were stopped last year as he was becoming very dizzy and  then Dr. [name] decided to stop most of them. She does not know if it was helpful or if it was making him worse. The patient currently denied having any suicidal or homicidal ideations or plans.   PAST MEDICAL HISTORY:  1. Long-standing hypertension.  2. Arteriosclerotic peripheral vascular disease.  3. Atherosclerotic coronary artery disease status post angioplasty and stenting in 2000. Repeat PTCA and stent for mild RCA lesion in January 2007.  4. Hyperlipidemia.  5. Non-insulin-dependent diabetes, which is poorly controlled.  6. History of colonic polyps.  7. Severe sleep apnea. 8. Depression.  9. Transitional cell carcinoma of the bladder.  10. COPD with tobacco abuse.  11. Recent right cerebrovascular accident with upper extremity weakness, dysarthria and dysphagia.  PAST SURGICAL HISTORY:  1. Cholecystectomy in 1987.  2. Tonsillectomy and adenoidectomy in 1993.  3. UPPP at [facility] in 1993.  4. ORIF procedure in September 2000.  5. Resection of multiple colon polyps in 1993.   PAST PSYCHIATRIC HISTORY: The patient currently follows with a neurologist, Dr. Sherryll Burger. He has never seen a psychiatrist. His primary care physician, Dr. Randa Lynn, has tried him on several psychotropic medications including Celexa, Effexor and Risperdal. He was taking Risperdal up to 0.5 mg 2 times a day, but the medication was stopped in  2012. The Celexa was also stopped at the same time. The wife reported that he might be going downhill since then. The patient does not have any history of trying to hurt himself.   SOCIAL HISTORY: He is currently married and has been living with his wife. She is very supportive.   FAMILY HISTORY: Father died of heart disease and mother has history of cancer. His brothers have history of diabetes. There is strong family history of heart disease, diabetes and bladder cancer.   DRUG ALLERGIES: ZOCOR caused myalgias.    CURRENT MEDICATIONS: 1. Amantadine 100 mg p.o. 2 times a day.  2. Amlodipine 10 mg p.o. daily. 3. Benazepril 40 mg p.o. daily. 4. Plavix 75 mg p.o. daily. 5. Levemir 20 units subcutaneous injection at bedtime.   REVIEW OF SYSTEMS:  CONSTITUTIONAL: The patient appeared very tired with slow speech.  RESPIRATORY: History of congestive cough and has COPD.  CARDIOVASCULAR: No chest pain, palpitations or pedal edema.  GASTROINTESTINAL: No nausea, vomiting, diarrhea or constipation.  GENITOURINARY: No dysuria, hematuria or incontinence.  ENDOCRINE: No polyuria or polydipsia.  HEMATOLOGIC: Bruises easily.  SKIN: No rash. MUSCULOSKELETAL: Arthritis. NEUROLOGIC: As mentioned in present illness.  MENTAL STATUS EXAMINATION: The patient is an older-looking male who was sitting in the chair. He maintained poor eye contact. His speech was low in tone and volume. Mood was depressed. Affect was blunted. Thought process was logical and goal-directed. Thought content - has auditory hallucinations. He denied having any suicidal or homicidal ideations or plans. He denied having any command auditory hallucinations. He demonstrated poor insight and judgment.   ANCILLARY DATA: Temperature 97, pulse 90, respirations 22 and blood pressure 137/75.  Glucose 175, BUN 29, creatinine 2.09, sodium 131, potassium 4.0, chloride 96, bicarbonate 27, anion gap 8, calcium 9.4, magnesium 1.9, protein 7.3,  albumin 3.0, bilirubin 0.9, alkaline phosphatase 206, AST 59 and ALT 73. WBC 6.0, RBC 5.42, hemoglobin 16.5, hematocrit 50.7 and MCV 94.   Urinalysis: Amber-colored, cloudy. Blood was negative.   DIAGNOSTIC IMPRESSION:  AXIS I:  1. 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 2. History of dementia.   AXIS II: None.   AXIS III: Please review the medical history.  TREATMENT PLAN: I discussed with the patient's wife, Roarke Marciano, who is the power of attorney, at length about the medication treatment, risks, benefits and alternatives. She reported that the patient might be doing a little bit better when he was taking the psychotropic medication in the past. She agreed with the plan to restart him back on the medications at this time due to his worsening depressive symptoms.  1. I will start him on Lexapro 10 mg in the morning.  2. I will also start him on Risperdal 0.25 mg p.o. at bedtime. 3. We will continue to monitor him for worsening of depressive symptoms. If he needs further help please do not hesitate to call the psychiatric service on call.   Thank you for allowing  me to participate in the care of this patient.  ____________________________ Ardeen Fillers. Garnetta Buddy, MD usf:[id number] D: 07/22/2012 15:28:41 ET T: 07/22/2012 15:58:13 ET JOB#: [id number]  cc: Ardeen Fillers. Garnetta Buddy, MD, <Dictator> Rhunette Croft MD ELECTRONICALLY SIGNED 07/24/2012 11:20

## 2014-10-15 NOTE — H&P (Signed)
PATIENT NAME:  Christopher Diaz, Christopher Diaz MR#:  [id number] DATE OF BIRTH:  [birth year]  DATE OF ADMISSION:  07/21/2012  PRIMARY CARE PHYSICIAN: Alonna BucklerAndrew Lamb, MD   CHIEF COMPLAINT: Coughing.   HISTORY OF PRESENT ILLNESS: This is a 81 year old man who presents with weakness and coughing. He has had two falls on Saturday, one at 1:00 in the morning, and one in the afternoon. He also, as per wife, is not motivated, does not get out of bed, does not leave the house unless he is going to a doctor's office. He did not get the flu shot this year, but he did have the Pneumovax back in 2012. The patient falls asleep very easily when I am talking with him, not the greatest historian. In the Emergency Room, he was found to have an elevated creatinine, thrombocytopenia, and the ER physician treated for COPD exacerbation.   PAST MEDICAL HISTORY: History of bladder cancer, Parkinson's, diabetes, hypertension, COPD, sleep apnea, wears oxygen and CPAP at night, coronary artery disease and peripheral vascular disease.   PAST SURGICAL HISTORY: Angioplasty of the legs, cervical spine surgery, cholecystectomy, coronary stents.   ALLERGIES: ZOCOR.  MEDICATIONS: Amantadine 100 mg twice a day, amlodipine 10 mg daily, benazepril 40 mg daily, Plavix 75 mg daily, Levemir 20 units subcutaneous injection at bedtime, metoprolol 50 mg twice a day.   SOCIAL HISTORY: Smoker, 1 pack per day for many years. No alcohol. No drug use. 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   FAMILY HISTORY: Father died of heart disease. Mother died of a cancer of the male organs. Two brothers with diabetes. Another brother with heart disease, diabetes and bladder cancer. Another sister with breast cancer.     REVIEW OF SYSTEMS:  CONSTITUTIONAL: Positive for fever. Positive for chills. Positive for weakness. No weight gain. No weight loss.  EYES: No blurry vision.  EARS, NOSE, MOUTH, AND THROAT: No hearing loss. Positive for runny nose. No sore  throat. No difficulty swallowing.  CARDIOVASCULAR: No chest pain. No palpitations.  RESPIRATORY: Positive for shortness of breath. Positive for cough, nonproductive. No hemoptysis.  GASTROINTESTINAL: No nausea. No vomiting. No abdominal pain. No diarrhea. No constipation. No bright red blood per rectum. No melena.  GENITOURINARY: No burning on urination, no hematuria.  MUSCULOSKELETAL: No joint pain or muscle pain.  INTEGUMENT: No rashes or eruptions.  NEUROLOGIC: No fainting or blackouts.  PSYCHIATRIC: Positive for depression.  ENDOCRINE: No thyroid problems.  HEMATOLOGIC/LYMPHATIC: No anemia. No easy bruising or bleeding.   PHYSICAL EXAMINATION: VITAL SIGNS: Temperature 97, pulse 90, respirations 22, blood pressure 137/75, pulse oximetry 95%.  GENERAL: No respiratory distress, lying flat in bed.  HEENT: Eyes: Conjunctivae and lids normal. Pupils are equal, round, and reactive to light. Extraocular muscles are intact. No nystagmus. Ears, nose, mouth, and throat: Tympanic membrane blocked by wax. Nasal mucosa: No erythema. Throat: No erythema. No exudate seen. Lips and gums: No lesions.  NECK: Positive lymphadenopathy left submandibular area. No thyromegaly. No thyroid nodules palpated.  RESPIRATORY: Decreased breath sounds bilaterally. Positive rhonchi and wheeze throughout entire lung field. No use of accessory muscles to breathe.  CARDIOVASCULAR: S1, S2 normal. A +2/6 systolic ejection murmur. Carotid upstroke 2+ bilaterally. No bruits.  EXTREMITIES: Dorsalis pulses 1+ bilaterally, trace edema of the lower extremity.  ABDOMEN: Soft, nontender. No organomegaly/splenomegaly. Normoactive bowel sounds. No masses felt.  LYMPHATIC: No lymph nodes in the neck.  MUSCULOSKELETAL: No clubbing, edema, or cyanosis.  SKIN: No ulcers or lesions seen.  NEUROLOGICAL:  Cranial nerves II through XII grossly intact. Deep tendon reflexes 1+ bilateral lower extremities.  PSYCHIATRIC: The patient is oriented to  person, place, and time but easily falls asleep.   LABORATORY AND RADIOLOGICAL DATA:  Chest x-ray showed COPD with probable underlying pulmonary fibrosis.  A nonfibrotic inflammatory interstitial infiltrate cannot be completely excluded. CT scan of the head shows changes of atrophy, chronic microvascular ischemic disease, small basal ganglia lacunar infarcts present.   Influenza A and Diaz negative. Magnesium 1.9. Ringle blood cell count 6.0, H and H 16.5 and 50.7, platelet count 116. Glucose 175, BUN 29, creatinine 2.09, sodium 131, potassium 4.0, chloride 96, CO2 27, calcium 9.4. Liver function tests: Alkaline phosphatase at 206, ALT 73, AST 59, albumin low at 3.0.   ASSESSMENT AND PLAN: 1. Chronic obstructive pulmonary disease exacerbation, plus or minus pneumonia: I will give Levaquin 500 mg IV daily, pharmacy to dose. The ER physician gave antibiotic prior to blood cultures, so I will not order blood cultures at this point. I will give Solu-Medrol and DuoNebs nebulizer solution.  2. Acute renal failure:  I will give IV fluid hydration, hold benazepril at this point. Continue to monitor clinically.  3. Diabetes: Sugars will probably be high on Solu-Medrol. Continue Levemir and sliding scale.  4. Coronary artery disease: On Plavix and metoprolol.  5. Parkinson's disease: On amantadine.  6. Tobacco abuse: Smoking cessation counseling done 3 minutes by me. Nicotine patch applied.  7. Sleep apnea:  CPAP at night.  8. Severe depression: Unable to get out of bed. Does not leave the house. I will get a psychiatric consultation.  9. Thrombocytopenia: Most likely secondary to infection. I will send off a flu swab and give empiric Tamiflu.   CODE STATUS:  The patient is a FULL CODE.         TIME SPENT ON ADMISSION: 55 minutes.   ____________________________ Herschell Dimes. Renae Gloss, MD rjw:[id number] D: 07/21/2012 15:31:53 ET T: 07/21/2012 16:22:35 ET JOB#: [id number]  cc: Herschell Dimes. Renae Gloss, MD,  <Dictator> Reola Mosher. Randa Lynn, MD Salley Scarlet MD ELECTRONICALLY SIGNED 07/25/2012 16:01

## 2014-10-17 NOTE — Discharge Summary (Signed)
PATIENT NAME:  Christopher Diaz, Christopher Diaz MR#:  [id number] DATE OF BIRTH:  08-28-1933  DATE OF ADMISSION:  06/03/2011 DATE OF DISCHARGE:  06/12/2011  PRIMARY CARE PHYSICIAN: Alonna BucklerAndrew Lamb, MD   REASON FOR ADMISSION: Headache, elevated blood pressure, cough, nausea.   DISCHARGE DIAGNOSES:  1. Headache secondary to malignant hypertension and possible sinusitis.  2. Malignant hypertension.  3. Confusion/delirium which is multifactorial from hypertensive encephalopathy, renal insufficiency, dehydration, medication-induced and also from insomnia.  4. Hypertensive encephalopathy.  5. Renal insufficiency.  6. Dehydration. 7. Insomnia.  8. Sinusitis. 9. History of bladder cancer.  10. Hypertension.  11. Hyperlipidemia.  12. Type 2 diabetes mellitus. 13. Obstructive sleep apnea. 14. Chronic obstructive pulmonary disease.  15. History of cerebrovascular accident with residual weakness and facial asymmetry.  16. History of depression. 17. Peripheral vascular disease.  18. Recently treated bronchitis.  19. Oral thrush. 20. History of Parkinson's disease with dementia.  CODE STATUS:  DO NOT RESUSCITATE.    CONSULT: Neurology, with Dr. Sherryll BurgerShah.    PERTINENT LABORATORY, DIAGNOSTIC AND RADIOLOGICAL DATA:  Noncontrast head CT 06/03/2011: Sinus disease with mild mucosal thickening of frontal and ethmoid sinuses and mild thickening of the sphenoid sinus, new compared with 11/20/2010. Otherwise, there are chronic Argabright matter changes of ischemia. No other acute intracranial abnormalities are noted. Portable chest x-ray on 06/03/2011: Findings suggestive of edema versus fibrosis with borderline cardiomegaly.  Noncontrast head CT on 06/08/2011: Involutional changes but without evidence of focal or acute abnormalities.  Blood cultures x2 from 06/05/2011 no growth to date.   Urinalysis with clear urine, 3+ blood and 408 RBCs. There are nitrates, leukocyte esterase. There are 5 WBCs, no bacteria.  CBC normal on  admission except for WBC elevated at 12.3. CBC normal from 06/08/2011.  Cardiac enzymes are negative on admission.  LFTs are normal on admission.  BUN 17, creatinine 1.46 on admission, creatinine 1.89 from 06/04/2011. Creatinine normal at 1.13 on the day of discharge, BUN 23.   BRIEF HISTORY AND HOSPITAL COURSE: The patient is a 81 year old male with extensive past medical history, including a history of chronic obstructive pulmonary disease, obstructive sleep apnea, diabetes, hypertension, hyperlipidemia, cerebrovascular accident, bladder tumor, peripheral vascular disease with history of Parkinson's dementia, who presented to the Emergency Department with complaints of headache, elevated blood pressure, cough, and nausea. Please see dictated admission History and Physical for pertinent details surrounding the onset of this hospitalization. Please see below for further details.    1. Headache: Likely multifactorial, could be from malignant hypertension and also sinus disease with possible sinusitis noted on head CT. He was admitted to the Medical floor and placed on gradual blood pressure reduction for his malignant hypertension. He was placed on antibiotics for sinusitis in addition to aspirin, loratadine and Flonase. With these measures, his headache had eventually resolved. He did have a repeat head CT during this hospitalization which was negative for acute abnormalities.  2. Malignant hypertension: As above, blood pressure much improved with gradual blood pressure reduction. He will continue Norvasc, benazepril and metoprolol for now and will need close outpatient follow up in terms of routine blood pressure checks.  3. Confusion with delirium: Likely multifactorial and it is slowly improving. This is likely multifactorial from hypertensive encephalopathy, some dehydration, some renal insufficiency, also from possible sinus disease in addition to medications (Sinemet, Zetia and venlafaxine, all of  which have now been stopped), in addition to insomnia for which he was given a sleep aid. This is all in the setting of  underlying Parkinson's dementia, and with the acute abnormalities above he developed confusion and delirium. He also had low-grade fever and leukocytosis on admission which could be from sinusitis; and he has completed antibiotics and has defervesced, and WBC count has normalized. He was followed in-house by Neurology. MRI of the brain was ordered, but the patient was not able to tolerate it since he was restless and could not lie still. A head CT head on admission and also repeat CT was negative for acute strokes. He could also have progressive dementia and worsening of his Parkinson's. His Sinemet has now been stopped as above. He was given a sleep aid for insomnia and did have some rest thereafter. With all these measures, confusion has slowly improved but has not completely resolved. He needs frequent reorientations and redirecting. ABG was benign, and there is no CO2 retention noted. Neurology was in overall agreement with medical management plan during this hospitalization and supportive care.  4. Renal insufficiency: Appears to be prerenal and from vomiting noted earlier, and renal function has improved with hydration. Since  chronic obstructive pulmonary disease is stable and without acute exacerbation, the patient can continue to use inhalers.  5. Sleep apnea: The patient is to continue CPAP at bedtime. 6. Hypertension: The patient is to continue metoprolol, benazepril and Norvasc.  7. Hyperlipidemia: Zetia was held as it was felt that this could also be leading to his confusion.  8. Type 2 diabetes mellitus: The patient is to continue to [place].  9. Oral thrush: The patient is not really keeping clotrimazole troches in his mouth; therefore, he has been switched to Diflucan and will be treated for a week. We have noticed some improvement of his thrush, and he has also been seen and  cleared by the [facility] Evaluation team.  10. History of bladder cancer with some hematuria noted on urinalysis: The patient is scheduled for an outpatient cystogram. He can continue to follow up with Dr. Lonna Cobb as an outpatient.  11. History of cerebrovascular accident: The patient is to continue Plavix.  12. The patient was seen by Physical Therapy prior to discharge, and PT recommended discharge to a skilled nursing facility which has been arranged for this patient.   On 06/12/2011, the patient was hemodynamically stable and with improvement of his confusion, was without a headache, and was felt to be stable for discharge home with close outpatient followup, to which the patient and his family (the eldest daughter and wife) were agreeable.   DISCHARGE DISPOSITION: [facility] skilled nursing facility.   DISCHARGE CONDITION: Improved, stable.   DISCHARGE ACTIVITY: As tolerated.  DISCHARGE DIET: Low sodium, pureed. Also, the patient is to supplement meals with Ensure  supplement t.i.d. with meals, NDDI with thin liquids with strict aspiration precautions. Small single bites and sips. Feed only when the patient is fully awake and alert. Feed p.o. slowly allowing the patient time to clear Diaz/T trials.   DISCHARGE MEDICATIONS:  1. Benazepril 20 mg daily.  2. Plavix 75 mg daily.  3. Trigenta 5 mg p.o. daily.   4. Risperdal 0.5 mg Diaz.i.d. 5. Norvasc 10 mg daily.  6. Metoprolol 25 mg Diaz.i.d.  7. Advair Diskus 250/50, 1 dose inhaled Diaz.i.d.    8. Albuterol metered dose inhaler, 1 to 2 puffs inhaled every 4 to 6 hours p.r.n.  9. DuoNebs 1 dose every six hours p.r.n.  10. Tylenol 325 mg, 1 to 2 tablets p.o. every 4 to 6 hours p.r.n. pain. 11. Diflucan 100 mg  p.o. daily x5 days.   DISCHARGE INSTRUCTIONS:  1. Take medications as prescribed.  2. Return to the Emergency Department  for recurrence of symptoms.   FOLLOW-UP INSTRUCTIONS:    1. Follow up with the house physician at the skilled nursing  facility within one week. The patient needs repeat blood pressure checks and repeat BMP within one week.  2. Follow up with Dr. Lonna Cobb within 1 to 2 weeks.   TIME SPENT ON DISCHARGE: Greater than 30 minutes. ____________________________ Elon Alas, MD [id number]:[id number] D: 06/12/2011 15:03:59 ET T: 06/12/2011 15:45:59 ET JOB#: [id number]  cc: Elon Alas, MD, <Dictator> Reola Mosher. Randa Lynn, MD [facility] Elon Alas MD ELECTRONICALLY SIGNED 06/28/2011 16:23

## 2014-10-17 NOTE — Consult Note (Signed)
PATIENT NAME:  Christopher Diaz, [name]:  [id number] DATE OF BIRTH:  22-Aug-1933  DATE OF CONSULTATION:  06/08/2011  REFERRING PHYSICIAN:  Dr Jacques NavyAhmadzia CONSULTING PHYSICIAN:  Hemang K. Sherryll BurgerShah, MD  REASON FOR CONSULTATION: Altered mental status.   HISTORY OF PRESENT ILLNESS: Mr. Christopher Diaz is a 81 year old Caucasian gentleman who had acute onset of headache on June 03, 2011, around 2 a.m., came to the hospital in the morning, found to have some sinusitis and since then he has been confused. There was a question about urinary tract infection plus stool impaction but that has been cleared up. He has not slept for at least 72 hours now.     The patient had a history of a stroke back in 2009 involving his right frontal, posterior frontal and parietal cortex subcortical region. He also has a risk factor of hypertension, diabetes, hyperlipidemia, obstructive sleep apnea, and chronic smoking. He also has extensive [place] matter microvascular ischemic changes.   The patient also has a history of vascular parkinsonism and due to per family's persistence I tried him on Sinemet to see if he has any improvement.  Apparently he was not taking Sinemet as prescribed per wife but Sinemet has started regularly when he is here in the hospital.   The patient also has history of RLS.   The patient had a recent cervical spine [facility] due to his gait impairment when he received cervical spine MRI had a significant cervical myelopathy.   He also has some features of diabetic polyneuropathy.   PAST MEDICAL HISTORY: Significant for chronic obstructive pulmonary disease, sleep apnea with CPAP, history of diabetes, hypertension, hyperlipidemia, history of stroke, history of bladder tumor, depression, history of cervical spine stenosis and peripheral vascular disease.    PAST SURGICAL HISTORY:  His surgery is status post cholecystectomy, bladder tumor removal and ACDF in May 2005.    ALLERGIES: He is allergic to Zocor.   MEDICATIONS: I reviewed his current medication list.   SOCIAL HISTORY: He continues to smoke over 1 pack per day. No alcohol or IV drug abuse.   FAMILY HISTORY: Significant that his father died of heart disease at 4359, and mother had a kidney cancer.   REVIEW OF SYSTEMS: Not obtainable due to patient's level of confusion.   PHYSICAL EXAMINATION:  VITAL SIGNS: His temperature was 98, heart rate was 114, recovery room was 20.  Blood pressure is 155/93, and pulse oximetry was 82% on 2L of O2.   His blood sugar was 229.   GENERAL: He is an elderly-looking Caucasian gentleman surrounded by his family members. He was pulling on his clothes, touching other people's hand, and trying to look at it closely. He was talking to unknown people in the room and moving his arms and legs as if he is acting out his dreams.  The family has noticed him to have delusions, visual hallucinations, and auditory hallucinations. Per family sometimes he acts agitated and sometimes he is very calm, but still confused.   LUNGS: Clear to auscultation.   HEART: S1, S2 heart sounds. Carotid exam did not reveal any bruit.   MENTAL STATUS EXAMINATION: Alert but confused as above. His attention and concentration is very limited, but he does follow 1-step commands such as on command he did stick out his tongue, showed me two fingers in his right hand, picked up his right leg, and wiggled his left toes.   He did take a long time to respond  to these questions/requests.   His pupils were equal, round, and reactive. He does have significant grayish discoloration of his lenses bilaterally.   I cannot check his visual fields or extraocular movements properly. His face looks symmetric. His tongue was midline. I cannot check his hearing properly.   On his motor examination, he does have some increased tone in his bilateral upper and lower extremities but that can suggest paratonia.   He moved  his arms and legs, but formal strength testing is not possible.   I cannot check his sensory exam reliably.   DATA: On his CT scan of the brain, he does have significant [place] matter microvascular ischemic changes and a previous stroke.   ASSESSMENT AND PLAN: Toxic metabolic encephalopathy in a patient who has a relatively poor cognitive baseline but per family he was relatively active such as he had recently driven to the bank and cashed a check etc., but he does have cognitive impediment based on my clinic assessment on April 20, 2011.  But this is too much worsening, likely representative of delirium.     He was not taking Sinemet on a regular basis versus he is getting Sinemet here in the hospital regularly which can cause visual hallucinations so we will cut back down on Sinemet to half 3 times a day for 1 day and then we will stop.     The patient has not slept for almost three days which can also add to his delirium. It will be okay to give him low-dose hypnotic.  Trazodone has not worked in him last night.   Dr Janee Morn has done a great job with ruling out the compounding or contributing factors such as constipation and the urinary tract infection. He did receive different antibiotics for his "sinusitis."   I cannot rule out component of medication side effect causing as well as sleep deprivation causing this.   He has extensive [place] matter disease which might be a significant contributor as well.   We can consider doing followup CT of the brain to see if he has another infarct which was not detected on his first scan which might be difficult to see some time.   He is not able to go for MRI of the brain due to cannot stay still.        I spent extensive time with the family counseling.   More than 50% of the time was counseling and coordination of care.  I talked to Dr. Janee Morn personally.   TIME SPENT:  Total 75 minutes    ____________________________ Hemang K. Sherryll Burger,  MD hks:vtd D: 06/08/2011 16:06:00 ET T: 06/09/2011 09:37:09 ET JOB#: [id number]  cc: Hemang K. Sherryll Burger, MD, <Dictator>  [facility] MD ELECTRONICALLY SIGNED 06/29/2011 9:09
